# Patient Record
Sex: Male | Born: 1995 | Race: Black or African American | Hispanic: No | Marital: Single | Smoking: Former smoker
Health system: Southern US, Community
[De-identification: ages and names within clinical notes are randomized; demographics above are authoritative.]

## PROBLEM LIST (undated history)

## (undated) ENCOUNTER — Emergency Department (HOSPITAL_BASED_OUTPATIENT_CLINIC_OR_DEPARTMENT_OTHER): Admission: EM | Payer: Self-pay

---

## 2015-01-25 ENCOUNTER — Encounter (HOSPITAL_BASED_OUTPATIENT_CLINIC_OR_DEPARTMENT_OTHER): Payer: Self-pay

## 2015-01-25 ENCOUNTER — Emergency Department (HOSPITAL_BASED_OUTPATIENT_CLINIC_OR_DEPARTMENT_OTHER)
Admission: EM | Admit: 2015-01-25 | Discharge: 2015-01-25 | Disposition: A | Discharge status: Home / Self Care | Payer: Worker's Compensation | Attending: Emergency Medicine | Admitting: Emergency Medicine

## 2015-01-25 DIAGNOSIS — W01198A Fall on same level from slipping, tripping and stumbling with subsequent striking against other object, initial encounter: Secondary | ICD-10-CM | POA: Insufficient documentation

## 2015-01-25 DIAGNOSIS — Y9389 Activity, other specified: Secondary | ICD-10-CM | POA: Insufficient documentation

## 2015-01-25 DIAGNOSIS — S0990XA Unspecified injury of head, initial encounter: Secondary | ICD-10-CM | POA: Diagnosis not present

## 2015-01-25 DIAGNOSIS — Y92002 Bathroom of unspecified non-institutional (private) residence single-family (private) house as the place of occurrence of the external cause: Secondary | ICD-10-CM | POA: Insufficient documentation

## 2015-01-25 DIAGNOSIS — R42 Dizziness and giddiness: Secondary | ICD-10-CM | POA: Diagnosis present

## 2015-01-25 DIAGNOSIS — J302 Other seasonal allergic rhinitis: Secondary | ICD-10-CM | POA: Diagnosis not present

## 2015-01-25 DIAGNOSIS — Y998 Other external cause status: Secondary | ICD-10-CM | POA: Diagnosis not present

## 2015-01-25 MED ORDER — LORATADINE 10 MG PO TABS: 10.0000 mg | ORAL_TABLET | Freq: Every day | ORAL | Status: DC

## 2015-01-25 MED ORDER — LORATADINE 10 MG PO TABS
10.0000 mg | ORAL_TABLET | Freq: Once | ORAL | Status: AC
  Administered 2015-01-25: 10 mg via ORAL
  Filled 2015-01-25: qty 1

## 2015-01-25 MED ORDER — IBUPROFEN 600 MG PO TABS: 600.0000 mg | ORAL_TABLET | Freq: Four times a day (QID) | ORAL | Status: DC | PRN

## 2015-01-25 MED ORDER — FLUTICASONE PROPIONATE 50 MCG/ACT NA SUSP: 2.0000 | Freq: Every day | NASAL | Status: DC

## 2015-01-25 MED ORDER — ACETAMINOPHEN 500 MG PO TABS
1000.0000 mg | ORAL_TABLET | Freq: Once | ORAL | Status: AC
  Administered 2015-01-25: 1000 mg via ORAL
  Filled 2015-01-25: qty 2

## 2015-01-25 NOTE — Discharge Instructions (Signed)
Allergies  Allergies may happen from anything your body is sensitive to. This may be food, medicines, pollens, chemicals, and many other things. Food allergies can be severe and deadly.  HOME CARE  If you do not know what causes a reaction, keep a diary. Write down the foods you ate and the symptoms that followed. Avoid foods that cause reactions.  If you have red raised spots (hives) or a rash:  Take medicine as told by your doctor.  Use medicines for red raised spots and itching as needed.  Apply cold cloths (compresses) to the skin. Take a cool bath. Avoid hot baths or showers.  If you are severely allergic:  It is often necessary to go to the hospital after you have treated your reaction.  Wear your medical alert jewelry.  You and your family must learn how to give a allergy shot or use an allergy kit (anaphylaxis kit).  Always carry your allergy kit or shot with you. Use this medicine as told by your doctor if a severe reaction is occurring. GET HELP RIGHT AWAY IF:  You have trouble breathing or are making high-pitched whistling sounds (wheezing).  You have a tight feeling in your chest or throat.  You have a puffy (swollen) mouth.  You have red raised spots, puffiness (swelling), or itching all over your body.  You have had a severe reaction that was helped by your allergy kit or shot. The reaction can return once the medicine has worn off.  You think you are having a food allergy. Symptoms most often happen within 30 minutes of eating a food.  Your symptoms have not gone away within 2 days or are getting worse.  You have new symptoms.  You want to retest yourself with a food or drink you think causes an allergic reaction. Only do this under the care of a doctor. MAKE SURE YOU:   Understand these instructions.  Will watch your condition.  Will get help right away if you are not doing well or get worse. Document Released: 08/17/2012 Document Reviewed:  08/17/2012 [facility] Patient Information 2015 [place]. This information is not intended to replace advice given to you by your health care provider. Make sure you discuss any questions you have with your health care provider.

## 2015-01-25 NOTE — ED Notes (Signed)
Pt fell while at work last week and hit his head on the wall.  No LOC, did not get medical treatment at the time.  Pt c/o intermittent headaches since then.

## 2015-01-25 NOTE — ED Provider Notes (Signed)
CSN: [id number]     Arrival date & time 01/25/15  2251 History  This chart was scribed for April Palumbo, MD by Steva Colder, ED Scribe. The patient was seen in room MH04/MH04 at 11:10 PM.     Chief Complaint  Patient presents with  . Fall     Patient is a 19 y.o. male presenting with fall. The history is provided by the patient. No language interpreter was used.  Fall This is a new problem. The current episode started more than 1 week ago (9 days ago). The problem occurs rarely. The problem has not changed since onset.Associated symptoms include headaches. Pertinent negatives include no chest pain, no abdominal pain and no shortness of breath. Nothing aggravates the symptoms. Nothing relieves the symptoms. He has tried nothing for the symptoms. The treatment provided mild relief.    Evan Levy is a 19 y.o. male who presents to the Emergency Department via EMS complaining of fall onset last week. He notes that he fell while at work he slipped in the bathroom and he hit his head on the wall. He states that he didn't receive medical treatment at that time. He states that his HA is not worsened at any particular point throughout the day. Pt is having associated symptoms of intermittent HA and intermittent dizziness. He notes that he has not tried any medications for the relief of his symptoms. He denies hitting his head, LOC, vomiting, weakness, gait problem, and any other symptoms.    History reviewed. No pertinent past medical history. History reviewed. No pertinent past surgical history. History reviewed. No pertinent family history. Social History  Substance Use Topics  . Smoking status: None  . Smokeless tobacco: None  . Alcohol Use: None    Review of Systems  Eyes: Negative for photophobia and visual disturbance.  Respiratory: Negative for shortness of breath.   Cardiovascular: Negative for chest pain.  Gastrointestinal: Negative for nausea, vomiting and abdominal pain.   Musculoskeletal: Negative for gait problem.  Neurological: Positive for headaches. Negative for syncope, facial asymmetry, speech difficulty, weakness and numbness.  All other systems reviewed and are negative.   Allergies  Review of patient's allergies indicates no known allergies.  Home Medications   Prior to Admission medications   Medication Sig Start Date End Date Taking? Authorizing Provider  fluticasone (FLONASE) 50 MCG/ACT nasal spray Place 2 sprays into both nostrils daily. 01/25/15   April Palumbo, MD  ibuprofen (ADVIL,MOTRIN) 600 MG tablet Take 1 tablet (600 mg total) by mouth every 6 (six) hours as needed. 01/25/15   April Palumbo, MD  loratadine (CLARITIN) 10 MG tablet Take 1 tablet (10 mg total) by mouth daily. 01/25/15   April Palumbo, MD   BP 140/99 mmHg  Pulse 86  Temp(Src) 98.7 F (37.1 C) (Oral)  Resp 18  Ht 5' 11" (1.803 m)  Wt 169 lb 8 oz (76.885 kg)  BMI 23.65 kg/m2  SpO2 100% Physical Exam  Constitutional: He is oriented to person, place, and time. He appears well-developed and well-nourished. No distress.  HENT:  Head: Normocephalic and atraumatic. Head is without raccoon's eyes and without Battle's sign.  Right Ear: No mastoid tenderness. Tympanic membrane is retracted. Tympanic membrane is not injected. No hemotympanum.  Left Ear: No mastoid tenderness. Tympanic membrane is retracted. Tympanic membrane is not injected. No hemotympanum.  Mouth/Throat: Oropharynx is clear and moist.  Postnasal drip, clear and colorless.   Eyes: EOM are normal. Pupils are equal, round, and reactive to light.  Neck: Normal range of motion. Neck supple.  Cardiovascular: Normal rate, regular rhythm and normal heart sounds.  Exam reveals no gallop and no friction rub.   No murmur heard. Pulmonary/Chest: Effort normal and breath sounds normal. No respiratory distress. He has no wheezes. He has no rales.  Abdominal: Soft. Bowel sounds are normal. There is no tenderness. There is  no rebound.  Musculoskeletal: Normal range of motion.  No step-offs, crepitus, or tenderness of the c-spine.   Lymphadenopathy:    He has no cervical adenopathy.  No lymphadenopathy.   Neurological: He is alert and oriented to person, place, and time. He has normal reflexes. He displays normal reflexes. No cranial nerve deficit. He exhibits normal muscle tone.  5/5 motor strength throughout, gait is normal  Skin: Skin is warm and dry.  Psychiatric: He has a normal mood and affect. His behavior is normal.  Nursing note and vitals reviewed.   ED Course  Procedures (including critical care time) DIAGNOSTIC STUDIES: Oxygen Saturation is 100% on RA, nl by my interpretation.    COORDINATION OF CARE: 11:13 PM Discussed treatment plan with pt at bedside which includes claritin and tylenol and pt agreed to plan.    Labs Review Labs Reviewed - No data to display  Imaging Review No results found. I have personally reviewed and evaluated these images and lab results as part of my medical decision-making.   EKG Interpretation None      MDM   Final diagnoses:  Seasonal allergies   Based on PECARN study there is no indication for a head CT in this pain I suspect this headache is not trauma related and is actually allergy related as patient is sniffling in the room with PND and retracted TMs B.  If patient had intracranial trauma we would have seen signs of this already since the fall was nine days ago and the patient does not think he hit his head.  Will treat symptomatically.  Follow up with your family doctor for ongoing care  I personally performed the services described in this documentation, which was scribed in my presence. The recorded information has been reviewed and is accurate.      Veatrice Kells, MD 01/26/15 0425

## 2015-03-10 ENCOUNTER — Encounter (HOSPITAL_BASED_OUTPATIENT_CLINIC_OR_DEPARTMENT_OTHER): Payer: Self-pay | Admitting: *Deleted

## 2015-03-10 ENCOUNTER — Emergency Department (HOSPITAL_BASED_OUTPATIENT_CLINIC_OR_DEPARTMENT_OTHER)
Admission: EM | Admit: 2015-03-10 | Discharge: 2015-03-10 | Discharge status: Against Medical Advice | Payer: Medicaid Other | Attending: Emergency Medicine | Admitting: Emergency Medicine

## 2015-03-10 DIAGNOSIS — R0981 Nasal congestion: Secondary | ICD-10-CM | POA: Insufficient documentation

## 2015-03-10 DIAGNOSIS — Z720 Tobacco use: Secondary | ICD-10-CM | POA: Insufficient documentation

## 2015-03-10 NOTE — ED Notes (Signed)
Nasal congestion.

## 2015-05-16 ENCOUNTER — Encounter (HOSPITAL_BASED_OUTPATIENT_CLINIC_OR_DEPARTMENT_OTHER): Payer: Self-pay | Admitting: *Deleted

## 2015-05-16 DIAGNOSIS — F17210 Nicotine dependence, cigarettes, uncomplicated: Secondary | ICD-10-CM | POA: Insufficient documentation

## 2015-05-16 DIAGNOSIS — Z8619 Personal history of other infectious and parasitic diseases: Secondary | ICD-10-CM | POA: Insufficient documentation

## 2015-05-16 DIAGNOSIS — Z79899 Other long term (current) drug therapy: Secondary | ICD-10-CM | POA: Insufficient documentation

## 2015-05-16 DIAGNOSIS — Z7951 Long term (current) use of inhaled steroids: Secondary | ICD-10-CM | POA: Diagnosis not present

## 2015-05-16 DIAGNOSIS — N342 Other urethritis: Secondary | ICD-10-CM | POA: Insufficient documentation

## 2015-05-16 DIAGNOSIS — R369 Urethral discharge, unspecified: Secondary | ICD-10-CM | POA: Diagnosis present

## 2015-05-16 LAB — URINALYSIS, ROUTINE W REFLEX MICROSCOPIC
Bilirubin Urine: NEGATIVE
GLUCOSE, UA: NEGATIVE mg/dL
HGB URINE DIPSTICK: NEGATIVE
Ketones, ur: NEGATIVE mg/dL
Nitrite: NEGATIVE
Protein, ur: NEGATIVE mg/dL
SPECIFIC GRAVITY, URINE: 1.026 (ref 1.005–1.030)
pH: 6.5 (ref 5.0–8.0)

## 2015-05-16 LAB — URINE MICROSCOPIC-ADD ON: BACTERIA UA: NONE SEEN

## 2015-05-16 NOTE — ED Notes (Signed)
Pt c/o penile discharge x 4 days

## 2015-05-17 ENCOUNTER — Emergency Department (HOSPITAL_BASED_OUTPATIENT_CLINIC_OR_DEPARTMENT_OTHER)
Admission: EM | Admit: 2015-05-17 | Discharge: 2015-05-17 | Disposition: A | Discharge status: Home / Self Care | Payer: Medicaid Other | Attending: Emergency Medicine | Admitting: Emergency Medicine

## 2015-05-17 DIAGNOSIS — N342 Other urethritis: Secondary | ICD-10-CM

## 2015-05-17 LAB — GC/CHLAMYDIA PROBE AMP (~~LOC~~) NOT AT ARMC
Chlamydia: POSITIVE — AB
NEISSERIA GONORRHEA: NEGATIVE

## 2015-05-17 MED ORDER — AZITHROMYCIN 250 MG PO TABS
1000.0000 mg | ORAL_TABLET | Freq: Once | ORAL | Status: AC
  Administered 2015-05-17: 1000 mg via ORAL
  Filled 2015-05-17: qty 4

## 2015-05-17 MED ORDER — LIDOCAINE HCL (PF) 1 % IJ SOLN
INTRAMUSCULAR | Status: AC
  Administered 2015-05-17: 1.2 mL
  Filled 2015-05-17: qty 5

## 2015-05-17 MED ORDER — CEFTRIAXONE SODIUM 250 MG IJ SOLR
250.0000 mg | Freq: Once | INTRAMUSCULAR | Status: AC
  Administered 2015-05-17: 250 mg via INTRAMUSCULAR
  Filled 2015-05-17: qty 250

## 2015-05-17 NOTE — Discharge Instructions (Signed)
Urethritis, Adult Urethritis is an inflammation of the tube through which urine exits your bladder (urethra).  CAUSES Urethritis is often caused by an infection in your urethra. The infection can be viral, like herpes. The infection can also be bacterial, like gonorrhea. RISK FACTORS Risk factors of urethritis include:  Having sex without using a condom.  Having multiple sexual partners.  Having poor hygiene. SIGNS AND SYMPTOMS Symptoms of urethritis are less noticeable in women than in men. These symptoms include:  Burning feeling when you urinate (dysuria).  Discharge from your urethra.  Blood in your urine (hematuria).  Urinating more than usual. DIAGNOSIS  To confirm a diagnosis of urethritis, your health care provider will do the following:  Ask about your sexual history.  Perform a physical exam.  Have you provide a sample of your urine for lab testing.  Use a cotton swab to gently collect a sample from your urethra for lab testing. TREATMENT  It is important to treat urethritis. Depending on the cause, untreated urethritis may lead to serious genital infections and possibly infertility. Urethritis caused by a bacterial infection is treated with antibiotic medicine. All sexual partners must be treated.  HOME CARE INSTRUCTIONS  Do not have sex until the test results are known and treatment is completed, even if your symptoms go away before you finish treatment.  If you were prescribed an antibiotic, finish it all even if you start to feel better. SEEK MEDICAL CARE IF:   Your symptoms are not improved in 3 days.  Your symptoms are getting worse.  You develop abdominal pain or pelvic pain (in women).  You develop joint pain.  You have a fever. SEEK IMMEDIATE MEDICAL CARE IF:   You have severe pain in the belly, back, or side.  You have repeated vomiting. MAKE SURE YOU:  Understand these instructions.  Will watch your condition.  Will get help right away  if you are not doing well or get worse.   This information is not intended to replace advice given to you by your health care provider. Make sure you discuss any questions you have with your health care provider.   Document Released: 10/16/2000 Document Revised: 09/06/2014 Document Reviewed: 12/21/2012 Elsevier Interactive Patient Education [employer].

## 2015-05-17 NOTE — ED Provider Notes (Signed)
CSN: [id number]     Arrival date & time 05/16/15  2255 History   First MD Initiated Contact with Patient 05/17/15 0200     Chief Complaint  Patient presents with  . Penile Discharge     (Consider location/radiation/quality/duration/timing/severity/associated sxs/prior Treatment) HPI  This is a 20 year old male with a four-day history of penile discharge. He is vague about the nature of the discharge. He is having some mild burning with urination. His pregnant girlfriend was recently diagnosed with, and treated for, chlamydia. They have not had sexual intercourse since she was treated. He denies abdominal pain.  History reviewed. No pertinent past medical history. History reviewed. No pertinent past surgical history. History reviewed. No pertinent family history. Social History  Substance Use Topics  . Smoking status: Current Every Day Smoker -- 1.00 packs/day    Types: Cigarettes  . Smokeless tobacco: None  . Alcohol Use: No    Review of Systems  All other systems reviewed and are negative.   Allergies  Review of patient's allergies indicates no known allergies.  Home Medications   Prior to Admission medications   Medication Sig Start Date End Date Taking? Authorizing Provider  fluticasone (FLONASE) 50 MCG/ACT nasal spray Place 2 sprays into both nostrils daily. 01/25/15   April Palumbo, MD  ibuprofen (ADVIL,MOTRIN) 600 MG tablet Take 1 tablet (600 mg total) by mouth every 6 (six) hours as needed. 01/25/15   April Palumbo, MD  loratadine (CLARITIN) 10 MG tablet Take 1 tablet (10 mg total) by mouth daily. 01/25/15   April Palumbo, MD   BP 133/74 mmHg  Pulse 75  Temp(Src) 97.2 F (36.2 C) (Oral)  Resp 16  Ht 5' 10" (1.778 m)  Wt 160 lb (72.576 kg)  BMI 22.96 kg/m2  SpO2 100%   Physical Exam  General: Well-developed, well-nourished male in no acute distress; appearance consistent with age of record HENT: normocephalic; atraumatic Eyes: Normal appearance Neck:  supple Heart: regular rate and rhythm Lungs: Normal respiratory effort and excursion Abdomen: soft; nondistended; nontender GU: Tanner 5 male, circumcised; no meatal discharge seen Extremities: No deformity; full range of motion Neurologic: Awake, alert and oriented; motor function intact in all extremities and symmetric; no facial droop Skin: Warm and dry Psychiatric: Normal mood and affect    ED Course  Procedures (including critical care time)   MDM   Nursing notes and vitals signs, including pulse oximetry, reviewed.  Summary of this visit's results, reviewed by myself:  Labs:  Results for orders placed or performed during the hospital encounter of 05/17/15 (from the past 24 hour(s))  Urinalysis, Routine w reflex microscopic (not at [facility])     Status: Abnormal   Collection Time: 05/16/15 11:05 PM  Result Value Ref Range   Color, Urine YELLOW YELLOW   APPearance CLEAR CLEAR   Specific Gravity, Urine 1.026 1.005 - 1.030   pH 6.5 5.0 - 8.0   Glucose, UA NEGATIVE NEGATIVE mg/dL   Hgb urine dipstick NEGATIVE NEGATIVE   Bilirubin Urine NEGATIVE NEGATIVE   Ketones, ur NEGATIVE NEGATIVE mg/dL   Protein, ur NEGATIVE NEGATIVE mg/dL   Nitrite NEGATIVE NEGATIVE   Leukocytes, UA TRACE (A) NEGATIVE  Urine microscopic-add on     Status: Abnormal   Collection Time: 05/16/15 11:05 PM  Result Value Ref Range   Squamous Epithelial / LPF 0-5 (A) NONE SEEN   WBC, UA 6-30 0 - 5 WBC/hpf   RBC / HPF 0-5 0 - 5 RBC/hpf   Bacteria, UA NONE SEEN  NONE SEEN   Urine-Other MUCOUS PRESENT    2:07 AM We'll treat for GC and chlamydia based on symptoms and positive chlamydia in his sexual partner.     Shanon Rosser, MD 05/17/15 [phone]

## 2015-05-18 LAB — RPR: RPR: NONREACTIVE

## 2015-05-18 LAB — HIV ANTIBODY (ROUTINE TESTING W REFLEX): HIV SCREEN 4TH GENERATION: NONREACTIVE

## 2015-07-01 ENCOUNTER — Encounter (HOSPITAL_BASED_OUTPATIENT_CLINIC_OR_DEPARTMENT_OTHER): Payer: Self-pay

## 2015-07-01 ENCOUNTER — Emergency Department (HOSPITAL_BASED_OUTPATIENT_CLINIC_OR_DEPARTMENT_OTHER)
Admission: EM | Admit: 2015-07-01 | Discharge: 2015-07-01 | Disposition: A | Discharge status: Home / Self Care | Payer: Medicaid Other | Attending: Emergency Medicine | Admitting: Emergency Medicine

## 2015-07-01 ENCOUNTER — Emergency Department (HOSPITAL_BASED_OUTPATIENT_CLINIC_OR_DEPARTMENT_OTHER): Payer: Medicaid Other

## 2015-07-01 DIAGNOSIS — Y9289 Other specified places as the place of occurrence of the external cause: Secondary | ICD-10-CM | POA: Diagnosis not present

## 2015-07-01 DIAGNOSIS — Y998 Other external cause status: Secondary | ICD-10-CM | POA: Diagnosis not present

## 2015-07-01 DIAGNOSIS — Y9389 Activity, other specified: Secondary | ICD-10-CM | POA: Insufficient documentation

## 2015-07-01 DIAGNOSIS — S62308A Unspecified fracture of other metacarpal bone, initial encounter for closed fracture: Secondary | ICD-10-CM

## 2015-07-01 DIAGNOSIS — F17210 Nicotine dependence, cigarettes, uncomplicated: Secondary | ICD-10-CM | POA: Diagnosis not present

## 2015-07-01 DIAGNOSIS — W500XXA Accidental hit or strike by another person, initial encounter: Secondary | ICD-10-CM | POA: Diagnosis not present

## 2015-07-01 DIAGNOSIS — S6991XA Unspecified injury of right wrist, hand and finger(s), initial encounter: Secondary | ICD-10-CM | POA: Diagnosis present

## 2015-07-01 DIAGNOSIS — S62398A Other fracture of other metacarpal bone, initial encounter for closed fracture: Secondary | ICD-10-CM | POA: Insufficient documentation

## 2015-07-01 MED ORDER — HYDROCODONE-ACETAMINOPHEN 5-325 MG PO TABS: 1.0000 | ORAL_TABLET | ORAL | Status: AC | PRN

## 2015-07-01 MED ORDER — ACETAMINOPHEN 325 MG PO TABS
650.0000 mg | ORAL_TABLET | Freq: Once | ORAL | Status: AC
Start: 2015-07-01 — End: 2015-07-01
  Administered 2015-07-01: 650 mg via ORAL
  Filled 2015-07-01: qty 2

## 2015-07-01 NOTE — Discharge Instructions (Signed)
1. Medications: vicodin for pain, usual home medications 2. Treatment: rest, drink plenty of fluids, ice, elevate, wear splint 3. Follow Up: please followup with the hand specialist (call Monday to make appointment) for discussion of your diagnoses and further evaluation after today's visit; if you do not have a primary care doctor use the resource guide provided to find one; please return to the ER for increased pain, swelling, numbness, color change, new or worsening symptoms   Emergency Department Resource Guide 1) Find a Doctor and Pay Out of Pocket Although you won't have to find out who is covered by your insurance plan, it is a good idea to ask around and get recommendations. You will then need to call the office and see if the doctor you have chosen will accept you as a new patient and what types of options they offer for patients who are self-pay. Some doctors offer discounts or will set up payment plans for their patients who do not have insurance, but you will need to ask so you aren't surprised when you get to your appointment.  2) Contact Your Local Health Department Not all health departments have doctors that can see patients for sick visits, but many do, so it is worth a call to see if yours does. If you don't know where your local health department is, you can check in your phone book. The CDC also has a tool to help you locate your state's health department, and many state websites also have listings of all of their local health departments.  3) Find a [facility] If your illness is not likely to be very severe or complicated, you may want to try a walk in clinic. These are popping up all over the country in pharmacies, drugstores, and shopping centers. They're usually staffed by nurse practitioners or physician assistants that have been trained to treat common illnesses and complaints. They're usually fairly quick and inexpensive. However, if you have serious medical issues or  chronic medical problems, these are probably not your best option.  No Primary Care Doctor: - Call Health Connect at  [phone] - they can help you locate a primary care doctor that  accepts your insurance, provides certain services, etc. - Physician Referral Service- [phone]  Chronic Pain Problems: Organization         Address  Phone   Notes  [facility]  [phone] Patients need to be referred by their primary care doctor.   Medication Assistance: Organization         Address  Phone   Notes  [facility] Medication [facility] [address] [place], [place] [address] [phone] --Must be a resident of [facility] -- Must have NO insurance coverage whatsoever (no Medicaid/ Medicare, etc.) -- The pt. MUST have a primary care doctor that directs their care regularly and follows them in the community   MedAssist  [phone]   [employer]  [phone]    Agencies that provide inexpensive medical care: Organization         Address  Phone   Notes  [place]  [phone]   [facility]    [phone]   [facility] [place], [place] [address] [phone]   [place] [address], [place] [phone]   Planned Parenthood    [phone]   [facility]    [phone])  [place]  [address], [place] Phone:  [phone], Fax:  [phone] Hours of Operation:  9 am - 6 pm, M-F.  Also accepts Medicaid/Medicare and self-pay.  [facility] for [place], [address], [place] Phone: [phone], Fax: [phone]. Hours of Operation:  8:30 am - 5:30 pm, M-F.  Also accepts Medicaid and self-pay.  [facility] [place] [address], [place] Phone: [phone]   [place], [place], [place]  [phone], Ext. [phone] Mondays & Thursdays: 7-9 AM.  First 15 patients are seen on a first come, first serve basis.    [place] Providers:  Organization         Address  Phone   Notes  [facility] [address], [place] [phone] Also accepts self-pay patients.  [facility] [address], [place]  [phone]   [place], [address], [place] [phone]   [facility] [address], [place] [phone]   Lucianne Lei [address], [place]   [phone] Only accepts [place] Access [place] patients after they have their name applied to their card.   Self-Pay (no insurance) in [facility]:  Organization         Address  Phone   Notes  Sickle Cell Patients, [facility] Internal [facility] [place] [phone]   [facility] [place] [phone]   [facility] [place]  [place], [place], [place] [phone]   [facility]/Dr. Osei-Bonsu  [address], [place] or [address], [place] [phone] Phone number for both [place] and [place] locations is the same.  [facility]gent [facility] [address] [place] [phone]   [facility] [address] [place] or [address] [phone]   [facility] [address] [place] [phone], phone; [phone], fax Sees patients 1st and 3rd Saturday of every month.  Must not qualify for public or private insurance (i.e. Medicaid, Medicare, [place] Health Choice, Veterans' Benefits)  Household income should be no more than 200% of the poverty level The clinic cannot treat you if you are pregnant or think you are pregnant  Sexually transmitted  diseases are not treated at the clinic.    Dental Care: Organization         Address  Phone  Notes  [facility] Department of [facility] [place] [phone] Accepts children up to age 49 who are enrolled in [place] or [place]; pregnant women with a Medicaid card; and children who have applied for Medicaid or [place] Health Choice, but were declined, whose parents can pay a reduced fee at time of service.  [facility] Department of [facility]  [address], [place] [phone] Accepts children up to age 29 who are enrolled in [place] or [place]; pregnant women with a Medicaid card; and children who have applied for Medicaid or  Health Choice, but were declined, whose parents can pay a reduced fee at time of service.  [place] Adult Dental Access  PROGRAM  [place] [phone] Patients are seen by appointment only. Walk-ins are not accepted. [place] will see patients 59 years of age and older. Monday - Tuesday (8am-5pm) Most Wednesdays (8:30-5pm) $30 per visit, cash only  [facility] Adult Dental Access PROGRAM  [address], [facility] [phone] Patients are seen by appointment only. Walk-ins are not accepted. [place] will see patients 81 years of age and older. One Wednesday Evening (Monthly: Volunteer Based).  $30 per visit, cash only  [place]  [phone] for adults; Children under age 54, call Graduate Pediatric Dentistry at [phone]. Children aged 50-14, please call [phone] to request a pediatric application.  Dental services are provided in all areas of dental care including fillings, crowns and bridges, complete and partial dentures, implants, gum treatment, root canals, and extractions. Preventive care is also provided. Treatment is provided to both adults and children. Patients are selected via a  lottery and there is often a waiting list.   [facility] [address] [place]  [phone] [email or web address]   [facility] [address] [place], [place] [phone], Ext. [phone] Second and Fourth Thursday of each month, opens at 6:30 AM; Clinic ends at 9 AM.  Patients are seen on a first-come first-served basis, and a limited number are seen during each clinic.   [facility]  [address] [place], [place] [phone]   Eligibility Requirements You must have lived in [place], [place], or [place] counties for at least the last three months.   You cannot be eligible for state or federal sponsored [employer], including [employer], [place], or [employer].   You generally cannot be eligible for healthcare insurance through your employer.    How to apply: Eligibility screenings are held every Tuesday and Wednesday afternoon from 1:00 pm until 4:00 pm. You do not need an appointment for the interview!  [facility] [address] [place], [place]   [place] Department  [place]  [phone]    Behavioral Health Resources in the Community: Intensive Outpatient Programs Organization         Address  Phone  Notes  [place]. [address], [place], [place] [phone]   [facility] [address], [place], [place]   ADS: Alcohol & Drug Svcs [address] [place], [place]   [place] [address],  [place], [place] or [phone]   Substance Abuse Resources Organization         Address  Phone  Notes  Alcohol and Drug Services  [phone]   [place]  [phone]   [facility] [place]   Chinita Pester  [phone]   Residential &  Outpatient Substance Abuse Program  [phone]   Psychological Services Organization         Address  Phone  Notes  [facility] [place]  [place]  [phone]   [place] [address], [place] or [phone]    Mobile Crisis Teams Organization         Address  Phone  Notes  [facility], Mobile Crisis Care Unit  [phone]   [facility]  [address] [place], [place]  [place], [place] [phone]    Self-Help/Support Groups Organization         Address  Phone             Notes  [facility]ental [facility] of [place] - variety of support groups  [place] Call for more information  Narcotics Anonymous (NA), [facility] [address], [employer] [place]  2 meetings at this location   Special educational needs teacher         Address  Phone  Notes  [facility]idential [facility] [place],    [place]  [phone]   [facility]  [address], [place] [address], [place], [place]   [place], [place] [phone] Admissions: 8am-3pm M-F  Incentives Substance [place] [address]    [place], [place] [phone]   [facility] [address] [place], [place], [place]   [facility] [address]  [place], [place]   [facility] - Intensive Outpatient [address], [place], [place]   [facility] ([place] [place].,  [place], [place] [phone] or [phone]   [facility] (RTS) [address] [place], [place] Accepts Medicaid  [facility] [place] [address]  [place] [phone] Substance Abuse/Addiction Treatment   [facility] Organization          Address  Phone  Notes  [facility]  [phone]   Domenic Schwab, PhD [address] Arlis [place], [place]   [phone] or [phone]   [place], [place] [phone]   [facility] [address], [place], [place] [phone] Insurance/Medicaid/sponsorship through [facility] and Families [address], [address]                                    [place], [place] [phone] [place] [address], [place] [phone]    Dr. Adele Schilder  [phone]   [facility] Dept. 1) [address], [place] 2) [place] 3)  [place] [address], [place] [phone]  [phone]   [place] [phone] or [phone] (After Hours)
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
## 2016-03-01 ENCOUNTER — Emergency Department (HOSPITAL_BASED_OUTPATIENT_CLINIC_OR_DEPARTMENT_OTHER)
Admission: EM | Admit: 2016-03-01 | Discharge: 2016-03-01 | Disposition: A | Discharge status: Home / Self Care | Payer: Medicaid Other | Attending: Emergency Medicine | Admitting: Emergency Medicine

## 2016-03-01 ENCOUNTER — Encounter (HOSPITAL_BASED_OUTPATIENT_CLINIC_OR_DEPARTMENT_OTHER): Payer: Self-pay | Admitting: Emergency Medicine

## 2016-03-01 DIAGNOSIS — F17210 Nicotine dependence, cigarettes, uncomplicated: Secondary | ICD-10-CM | POA: Insufficient documentation

## 2016-03-01 DIAGNOSIS — N342 Other urethritis: Secondary | ICD-10-CM

## 2016-03-01 LAB — URINALYSIS, ROUTINE W REFLEX MICROSCOPIC
BILIRUBIN URINE: NEGATIVE
GLUCOSE, UA: NEGATIVE mg/dL
Hgb urine dipstick: NEGATIVE
KETONES UR: NEGATIVE mg/dL
Nitrite: NEGATIVE
PH: 6.5 (ref 5.0–8.0)
Protein, ur: NEGATIVE mg/dL
Specific Gravity, Urine: 1.024 (ref 1.005–1.030)

## 2016-03-01 LAB — URINE MICROSCOPIC-ADD ON

## 2016-03-01 MED ORDER — LIDOCAINE HCL (PF) 1 % IJ SOLN
INTRAMUSCULAR | Status: AC
  Administered 2016-03-01: 1.2 mL
  Filled 2016-03-01: qty 5

## 2016-03-01 MED ORDER — AZITHROMYCIN 250 MG PO TABS
1000.0000 mg | ORAL_TABLET | Freq: Once | ORAL | Status: AC
  Administered 2016-03-01: 1000 mg via ORAL
  Filled 2016-03-01: qty 4

## 2016-03-01 MED ORDER — CEFTRIAXONE SODIUM 250 MG IJ SOLR
250.0000 mg | Freq: Once | INTRAMUSCULAR | Status: AC
  Administered 2016-03-01: 250 mg via INTRAMUSCULAR
  Filled 2016-03-01: qty 250

## 2016-03-01 NOTE — ED Provider Notes (Signed)
[place] DEPT [facility] Provider Note   CSN: [id number] Arrival date & time: 03/01/16  1429     History   Chief Complaint Chief Complaint  Patient presents with  . Hematuria    HPI Evan Levy is a 20 y.o. male.  Patient is a 20 year old male with no significant past medical history. He presents for evaluation of burning with urination for the past several days. Yesterday he took a urine drug test as part of the job application. He states that he noted a slight amount of blood in his urine. He is sexually active.   The history is provided by the patient.  Hematuria  This is a new problem. The problem occurs constantly. The problem has not changed since onset.Exacerbated by: Urinate. Nothing relieves the symptoms. He has tried nothing for the symptoms.    History reviewed. No pertinent past medical history.  There are no active problems to display for this patient.   History reviewed. No pertinent surgical history.     Home Medications    Prior to Admission medications   Medication Sig Start Date End Date Taking? Authorizing Provider  HYDROcodone-acetaminophen (NORCO/VICODIN) 5-325 MG tablet Take 1 tablet by mouth every 4 (four) hours as needed. 07/01/15   Marella Chimes, PA-C    Family History History reviewed. No pertinent family history.  Social History Social History  Substance Use Topics  . Smoking status: Current Every Day Smoker    Packs/day: 1.00    Types: Cigarettes  . Smokeless tobacco: Never Used  . Alcohol use No     Allergies   Review of patient's allergies indicates no known allergies.   Review of Systems Review of Systems  Genitourinary: Positive for hematuria.  All other systems reviewed and are negative.    Physical Exam Updated Vital Signs BP 138/92 (BP Location: Left Arm)   Pulse 76   Temp 98.3 F (36.8 C) (Oral)   Resp 18   Ht 5' 10" (1.778 m)   Wt 169 lb (76.7 kg)   SpO2 100%   BMI 24.25 kg/m   Physical Exam    Constitutional: He is oriented to person, place, and time. He appears well-developed and well-nourished. No distress.  HENT:  Head: Normocephalic and atraumatic.  Neck: Normal range of motion. Neck supple.  Genitourinary: Penis normal.  Genitourinary Comments: There is a slight penile discharge present. There is otherwise no obvious abnormality of the genitalia.  Neurological: He is alert and oriented to person, place, and time.  Skin: Skin is warm and dry. He is not diaphoretic.  Nursing note and vitals reviewed.    ED Treatments / Results  Labs (all labs ordered are listed, but only abnormal results are displayed) Labs Reviewed  URINALYSIS, ROUTINE W REFLEX MICROSCOPIC (NOT AT [facility])  GC/CHLAMYDIA PROBE AMP ([place]) NOT AT [facility]    EKG  EKG Interpretation None       Radiology No results found.  Procedures Procedures (including critical care time)  Medications Ordered in ED Medications - No data to display   Initial Impression / Assessment and Plan / ED Course  I have reviewed the triage vital signs and the nursing notes.  Pertinent labs & imaging results that were available during my care of the patient were reviewed by me and considered in my medical decision making (see chart for details).  Clinical Course    Patient presents with complaints of burning with urination. He has a history of chlamydia diagnosed in January 2017 and  this feels similar. He is vague when asked about sexual contacts. His urinalysis reveals moderate leukocytes. I suspect an STD. He will be treated with Rocephin and Zithromax pending culture results.  Final Clinical Impressions(s) / ED Diagnoses   Final diagnoses:  None    New Prescriptions New Prescriptions   No medications on file     Veryl Speak, MD 03/01/16 1519

## 2016-03-01 NOTE — Discharge Instructions (Signed)
We will call you if your cultures indicate you require further treatment or need to take further action.  Return to the emergency department if symptoms significantly worsen or change.

## 2016-03-01 NOTE — ED Triage Notes (Signed)
Patient reports that he has burning with urination "for a while" - yesterday he took a drug test and he noticed that after he held his urine he had blood in his urine. Today he continues to have burning and is now worried

## 2016-03-01 NOTE — ED Notes (Signed)
MD at bedside.

## 2016-03-04 LAB — GC/CHLAMYDIA PROBE AMP (~~LOC~~) NOT AT ARMC
CHLAMYDIA, DNA PROBE: NEGATIVE
NEISSERIA GONORRHEA: NEGATIVE

## 2016-12-17 ENCOUNTER — Emergency Department (HOSPITAL_BASED_OUTPATIENT_CLINIC_OR_DEPARTMENT_OTHER)
Admission: EM | Admit: 2016-12-17 | Discharge: 2016-12-17 | Disposition: A | Discharge status: Home / Self Care | Payer: Medicaid Other | Attending: Emergency Medicine | Admitting: Emergency Medicine

## 2016-12-17 ENCOUNTER — Encounter (HOSPITAL_BASED_OUTPATIENT_CLINIC_OR_DEPARTMENT_OTHER): Payer: Self-pay | Admitting: *Deleted

## 2016-12-17 DIAGNOSIS — N341 Nonspecific urethritis: Secondary | ICD-10-CM

## 2016-12-17 DIAGNOSIS — F17210 Nicotine dependence, cigarettes, uncomplicated: Secondary | ICD-10-CM | POA: Insufficient documentation

## 2016-12-17 DIAGNOSIS — A64 Unspecified sexually transmitted disease: Secondary | ICD-10-CM

## 2016-12-17 DIAGNOSIS — N342 Other urethritis: Secondary | ICD-10-CM | POA: Insufficient documentation

## 2016-12-17 MED ORDER — CEFTRIAXONE SODIUM 250 MG IJ SOLR
250.0000 mg | Freq: Once | INTRAMUSCULAR | Status: AC
  Administered 2016-12-17: 250 mg via INTRAMUSCULAR
  Filled 2016-12-17: qty 250

## 2016-12-17 MED ORDER — AZITHROMYCIN 1 G PO PACK
1.0000 g | PACK | Freq: Once | ORAL | Status: AC
  Administered 2016-12-17: 1 g via ORAL
  Filled 2016-12-17: qty 1

## 2016-12-17 MED ORDER — DOXYCYCLINE HYCLATE 100 MG PO CAPS: 100.0000 mg | ORAL_CAPSULE | Freq: Two times a day (BID) | ORAL | 0 refills | Status: AC

## 2016-12-17 NOTE — ED Notes (Signed)
ED Provider at bedside.

## 2016-12-17 NOTE — ED Provider Notes (Signed)
[facility] Provider Note   CSN: [id number] Arrival date & time: 12/17/16  0609     History   Chief Complaint Chief Complaint  Patient presents with  . Penile Discharge    HPI Evan Levy is a 21 y.o. male.  The history is provided by the patient.  Penile Discharge  This is a recurrent problem. The current episode started more than 2 days ago. The problem occurs constantly. The problem has not changed since onset.Pertinent negatives include no chest pain, no abdominal pain, no headaches and no shortness of breath. Nothing aggravates the symptoms. Nothing relieves the symptoms. He has tried nothing for the symptoms. The treatment provided no relief.    History reviewed. No pertinent past medical history.  There are no active problems to display for this patient.   History reviewed. No pertinent surgical history.     Home Medications    Prior to Admission medications   Medication Sig Start Date End Date Taking? Authorizing Provider  HYDROcodone-acetaminophen (NORCO/VICODIN) 5-325 MG tablet Take 1 tablet by mouth every 4 (four) hours as needed. 07/01/15   Marella Chimes, PA-C    Family History No family history on file.  Social History Social History  Substance Use Topics  . Smoking status: Current Every Day Smoker    Packs/day: 1.00    Types: Cigarettes  . Smokeless tobacco: Never Used  . Alcohol use No     Allergies   Patient has no known allergies.   Review of Systems Review of Systems  Respiratory: Negative for shortness of breath.   Cardiovascular: Negative for chest pain.  Gastrointestinal: Negative for abdominal pain.  Genitourinary: Positive for discharge. Negative for decreased urine volume, dysuria, genital sores and urgency.  Neurological: Negative for headaches.  All other systems reviewed and are negative.    Physical Exam Updated Vital Signs BP 130/89   Pulse 75   Temp 98.2 F (36.8 C) (Oral)   Resp 16   Ht 5'  8" (1.727 m)   Wt 75.3 kg (166 lb)   SpO2 98%   BMI 25.24 kg/m   Physical Exam  Constitutional: He appears well-developed and well-nourished.  HENT:  Head: Normocephalic and atraumatic.  Mouth/Throat: No oropharyngeal exudate.  Eyes: Conjunctivae and EOM are normal.  Neck: Normal range of motion.  Cardiovascular: Normal rate, regular rhythm, normal heart sounds and intact distal pulses.   Pulmonary/Chest: Effort normal and breath sounds normal. No respiratory distress. He has no wheezes. He has no rales.  Abdominal: Soft. Bowel sounds are normal. He exhibits no mass. There is no tenderness. There is no rebound and no guarding.  Genitourinary: Discharge found.  Genitourinary Comments: Yellow purulent thick discharge.  Chaperone present.  Minimally enlarged freely mobile lymph nodes B  Musculoskeletal: Normal range of motion.  Neurological: He is alert.  Skin: Skin is warm and dry. Capillary refill takes less than 2 seconds.  Psychiatric: He has a normal mood and affect.     ED Treatments / Results   Vitals:   12/17/16 0621  BP: 130/89  Pulse: 75  Resp: 16  Temp: 98.2 F (36.8 C)  SpO2: 98%    Radiology No results found.  Procedures Procedures (including critical care time)  Medications Ordered in ED Medications  cefTRIAXone (ROCEPHIN) injection 250 mg (not administered)  azithromycin (ZITHROMAX) powder 1 g (not administered)       Final Clinical Impressions(s) / ED Diagnoses  Urethritis:  Given nodes will d/c on 7 days of  doxy.  [facility]  No sexual activity of any kind until 7 days after all partners treated.  You must inform all partners.  After that period you should only have sex using a condom.    The patient is very well appearing and has been observed in the ED.  Strict return precautions given for facial swelling, persistent fevers > 1 week, drooling, swelling of the mouth or throat, intractable vomiting, weakness,   intractable abdominal pain, hard or rigid abdomen, inability to pass gas or stool, intractable diarrhea.  inability to tolerate oral liquids or foods, shortness of breath, changes in vision or thinking, chest pain, dyspnea on exertion, weakness or numbness or any concerns. No signs of systemic illness or infection. The patient is nontoxic-appearing on exam and vital signs are within normal limits.   I have reviewed the triage vital signs and the nursing notes. Pertinent labs &imaging results that were available during my care of the patient were reviewed by me and considered in my medical decision making (see chart for details).  After history, exam, and medical workup I feel the patient has been appropriately medically screened and is safe for discharge home. Pertinent diagnoses were discussed with the patient. Patient was given return precautions.    Deyjah Kindel, MD 12/17/16 [phone]

## 2016-12-17 NOTE — ED Triage Notes (Signed)
Pt ambulatory to treatment room. Patient states painful urination and yellow penile discharge since yesterday. Pt also reports "knots" in his pelvis area for several days. Several days ago he had pain in his throat, now feels like he is having swelling in his throat. No difficulty swallowing.

## 2016-12-18 LAB — GC/CHLAMYDIA PROBE AMP (~~LOC~~) NOT AT ARMC
CHLAMYDIA, DNA PROBE: NEGATIVE
Neisseria Gonorrhea: POSITIVE — AB
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
## 2019-11-15 ENCOUNTER — Other Ambulatory Visit: Payer: Self-pay

## 2019-11-15 ENCOUNTER — Emergency Department (HOSPITAL_BASED_OUTPATIENT_CLINIC_OR_DEPARTMENT_OTHER): Payer: Self-pay

## 2019-11-15 ENCOUNTER — Encounter (HOSPITAL_BASED_OUTPATIENT_CLINIC_OR_DEPARTMENT_OTHER): Payer: Self-pay | Admitting: *Deleted

## 2019-11-15 ENCOUNTER — Emergency Department (HOSPITAL_BASED_OUTPATIENT_CLINIC_OR_DEPARTMENT_OTHER)
Admission: EM | Admit: 2019-11-15 | Discharge: 2019-11-15 | Disposition: A | Discharge status: Home / Self Care | Payer: Self-pay | Attending: Emergency Medicine | Admitting: Emergency Medicine

## 2019-11-15 DIAGNOSIS — Z5321 Procedure and treatment not carried out due to patient leaving prior to being seen by health care provider: Secondary | ICD-10-CM | POA: Insufficient documentation

## 2019-11-15 DIAGNOSIS — M25562 Pain in left knee: Secondary | ICD-10-CM | POA: Insufficient documentation

## 2019-11-15 NOTE — ED Triage Notes (Signed)
Left knee pain. No known injury.

## 2019-11-15 NOTE — ED Notes (Signed)
Pt reported to registration clerk that they were going to leave

## 2019-11-16 ENCOUNTER — Other Ambulatory Visit: Payer: Self-pay

## 2019-11-16 ENCOUNTER — Encounter (HOSPITAL_BASED_OUTPATIENT_CLINIC_OR_DEPARTMENT_OTHER): Payer: Self-pay | Admitting: *Deleted

## 2019-11-16 ENCOUNTER — Emergency Department (HOSPITAL_BASED_OUTPATIENT_CLINIC_OR_DEPARTMENT_OTHER)
Admission: EM | Admit: 2019-11-16 | Discharge: 2019-11-17 | Disposition: A | Discharge status: Home / Self Care | Payer: Medicaid Other | Attending: Emergency Medicine | Admitting: Emergency Medicine

## 2019-11-16 DIAGNOSIS — G8929 Other chronic pain: Secondary | ICD-10-CM

## 2019-11-16 DIAGNOSIS — M25462 Effusion, left knee: Secondary | ICD-10-CM | POA: Insufficient documentation

## 2019-11-16 DIAGNOSIS — F17210 Nicotine dependence, cigarettes, uncomplicated: Secondary | ICD-10-CM | POA: Insufficient documentation

## 2019-11-16 DIAGNOSIS — M25562 Pain in left knee: Secondary | ICD-10-CM | POA: Insufficient documentation

## 2019-11-16 NOTE — ED Triage Notes (Signed)
He was here yesterday for knee pain. He had an xray. He left before seen by an MD.

## 2019-11-17 NOTE — ED Provider Notes (Signed)
[facility] EMERGENCY DEPARTMENT Provider Note  CSN: [id number] Arrival date & time: 11/16/19 2100  Chief Complaint(s) Knee Pain  HPI Evan Levy is a 24 y.o. male   CC: knee pain  Onset/Duration: on going for several years. This episode for 2-3 days Timing: intermittent, constant this epsode Location: left lateral knee Quality: aching Severity: mild to moderate Modifying Factors:  Improved by: immobility  Worsened by: walking, ROM Associated Signs/Symptoms:  Pertinent (+): mild swelling  Pertinent (-): redness, trauma,  Context: reports that fell asleep with his legs hanging of the bed and noted the pain in the morning.   Was here yesterday but LWBS after having xray.  HPI  Past Medical History History reviewed. No pertinent past medical history. There are no problems to display for this patient.  Home Medication(s) Prior to Admission medications   Medication Sig Start Date End Date Taking? Authorizing Provider  doxycycline (VIBRAMYCIN) 100 MG capsule Take 1 capsule (100 mg total) by mouth 2 (two) times daily. One po bid x 7 days 12/17/16   Palumbo, April, MD  HYDROcodone-acetaminophen (NORCO/VICODIN) 5-325 MG tablet Take 1 tablet by mouth every 4 (four) hours as needed. 07/01/15   Eustace Quail                                                                                                                                    Past Surgical History History reviewed. No pertinent surgical history. Family History No family history on file.  Social History Social History   Tobacco Use  . Smoking status: Current Every Day Smoker    Packs/day: 1.00    Types: Cigarettes  . Smokeless tobacco: Never Used  Substance Use Topics  . Alcohol use: No  . Drug use: No   Allergies Patient has no known allergies.  Review of Systems Review of Systems All other systems are reviewed and are negative for acute change except as noted in the HPI  Physical  Exam Vital Signs  I have reviewed the triage vital signs BP 129/76   Pulse 87   Temp 99.4 F (37.4 C) (Oral)   Resp 20   Ht 5' 8" (1.727 m)   Wt 85.3 kg   SpO2 96%   BMI 28.59 kg/m   Physical Exam Vitals reviewed.  Constitutional:      General: He is not in acute distress.    Appearance: He is well-developed. He is not diaphoretic.  HENT:     Head: Normocephalic and atraumatic.     Jaw: No trismus.     Right Ear: External ear normal.     Left Ear: External ear normal.     Nose: Nose normal.  Eyes:     General: No scleral icterus.    Conjunctiva/sclera: Conjunctivae normal.  Neck:     Trachea: Phonation normal.  Cardiovascular:     Rate and Rhythm: Normal rate and regular rhythm.  Pulmonary:     Effort: Pulmonary effort is normal. No respiratory distress.     Breath sounds: No stridor.  Abdominal:     General: There is no distension.  Musculoskeletal:        General: Normal range of motion.     Cervical back: Normal range of motion.     Right knee: No swelling, deformity, effusion, erythema, ecchymosis, lacerations or bony tenderness. Normal range of motion. No tenderness. Normal alignment.     Left knee: No swelling, deformity, effusion, ecchymosis, lacerations, bony tenderness or crepitus. Normal range of motion. Tenderness present over the lateral joint line and LCL. No LCL laxity, MCL laxity, ACL laxity or PCL laxity.Normal alignment and normal patellar mobility.     Instability Tests: Anterior drawer test negative. Posterior drawer test negative.  Neurological:     Mental Status: He is alert and oriented to person, place, and time.  Psychiatric:        Behavior: Behavior normal.     ED Results and Treatments Labs (all labs ordered are listed, but only abnormal results are displayed) Labs Reviewed - No data to display                                                                                                                       EKG  EKG  Interpretation  Date/Time:    Ventricular Rate:    PR Interval:    QRS Duration:   QT Interval:    QTC Calculation:   R Axis:     Text Interpretation:        Radiology No results found.  Pertinent labs & imaging results that were available during my care of the patient were reviewed by me and considered in my medical decision making (see chart for details).  Medications Ordered in ED Medications - No data to display                                                                                                                                  Procedures Procedures  (including critical care time)  Medical Decision Making / ED Course I have reviewed the nursing notes for this encounter and the patient's prior records (if available in EHR or on provided paperwork).   Chong Wojdyla was evaluated in Emergency Department on 11/17/2019 for the symptoms described in the history of present illness. He was evaluated in the context of the  global COVID-19 pandemic, which necessitated consideration that the patient might be at risk for infection with the SARS-CoV-2 virus that causes COVID-19. Institutional protocols and algorithms that pertain to the evaluation of patients at risk for COVID-19 are in a state of rapid change based on information released by regulatory bodies including the CDC and federal and state organizations. These policies and algorithms were followed during the patient's care in the ED.  Plain film from yesterday negative. Mild discomfort along the lateral ligament. Possible strain from sleeping position. No injury per patient. No suspicion for infectious process. RICE recommended      Final Clinical Impression(s) / ED Diagnoses Final diagnoses:  Chronic pain of left knee   The patient appears reasonably screened and/or stabilized for discharge and I doubt any other medical condition or other [facility] requiring further screening, evaluation, or treatment in the ED at this  time prior to discharge. Safe for discharge with strict return precautions.  Disposition: Discharge  Condition: Good  I have discussed the results, Dx and Tx plan with the patient/family who expressed understanding and agree(s) with the plan. Discharge instructions discussed at length. The patient/family was given strict return precautions who verbalized understanding of the instructions. No further questions at time of discharge.    ED Discharge Orders    None        Follow Up: Primary care provider  Schedule an appointment as soon as possible for a visit  If you do not have a primary care physician, contact [facility] at [phone] for referral      This chart was dictated using voice recognition software.  Despite best efforts to proofread,  errors can occur which can change the documentation meaning.   Fatima Blank, MD 11/17/19 [phone]

## 2022-01-14 ENCOUNTER — Encounter (HOSPITAL_BASED_OUTPATIENT_CLINIC_OR_DEPARTMENT_OTHER): Payer: Self-pay | Admitting: *Deleted

## 2022-01-14 ENCOUNTER — Other Ambulatory Visit: Payer: Self-pay

## 2022-01-14 ENCOUNTER — Emergency Department (HOSPITAL_BASED_OUTPATIENT_CLINIC_OR_DEPARTMENT_OTHER): Payer: Commercial Managed Care - HMO

## 2022-01-14 ENCOUNTER — Emergency Department (HOSPITAL_BASED_OUTPATIENT_CLINIC_OR_DEPARTMENT_OTHER)
Admission: EM | Admit: 2022-01-14 | Discharge: 2022-01-14 | Disposition: A | Discharge status: Home / Self Care | Payer: Commercial Managed Care - HMO | Attending: Emergency Medicine | Admitting: Emergency Medicine

## 2022-01-14 DIAGNOSIS — E700 Classical phenylketonuria: Secondary | ICD-10-CM | POA: Diagnosis not present

## 2022-01-14 DIAGNOSIS — R1013 Epigastric pain: Secondary | ICD-10-CM | POA: Insufficient documentation

## 2022-01-14 DIAGNOSIS — D72829 Elevated white blood cell count, unspecified: Secondary | ICD-10-CM | POA: Diagnosis not present

## 2022-01-14 DIAGNOSIS — R110 Nausea: Secondary | ICD-10-CM | POA: Diagnosis not present

## 2022-01-14 LAB — URINALYSIS, ROUTINE W REFLEX MICROSCOPIC
Bilirubin Urine: NEGATIVE
Glucose, UA: NEGATIVE mg/dL
Hgb urine dipstick: NEGATIVE
Ketones, ur: 40 mg/dL — AB
Leukocytes,Ua: NEGATIVE
Nitrite: NEGATIVE
Protein, ur: NEGATIVE mg/dL
Specific Gravity, Urine: 1.02 (ref 1.005–1.030)
pH: 7 (ref 5.0–8.0)

## 2022-01-14 LAB — CBC
HCT: 46.5 % (ref 39.0–52.0)
Hemoglobin: 15.8 g/dL (ref 13.0–17.0)
MCH: 29.4 pg (ref 26.0–34.0)
MCHC: 34 g/dL (ref 30.0–36.0)
MCV: 86.4 fL (ref 80.0–100.0)
Platelets: 242 10*3/uL (ref 150–400)
RBC: 5.38 MIL/uL (ref 4.22–5.81)
RDW: 13.2 % (ref 11.5–15.5)
WBC: 4.9 10*3/uL (ref 4.0–10.5)
nRBC: 0 % (ref 0.0–0.2)

## 2022-01-14 LAB — COMPREHENSIVE METABOLIC PANEL
ALT: 13 U/L (ref 0–44)
AST: 17 U/L (ref 15–41)
Albumin: 4.1 g/dL (ref 3.5–5.0)
Alkaline Phosphatase: 48 U/L (ref 38–126)
Anion gap: 9 (ref 5–15)
BUN: 13 mg/dL (ref 6–20)
CO2: 22 mmol/L (ref 22–32)
Calcium: 9.4 mg/dL (ref 8.9–10.3)
Chloride: 107 mmol/L (ref 98–111)
Creatinine, Ser: 1.28 mg/dL — ABNORMAL HIGH (ref 0.61–1.24)
GFR, Estimated: >60 mL/min (ref >60)
Glucose, Bld: 86 mg/dL (ref 70–99)
Potassium: 3.7 mmol/L (ref 3.5–5.1)
Sodium: 138 mmol/L (ref 135–145)
Total Bilirubin: 1.6 mg/dL — ABNORMAL HIGH (ref 0.3–1.2)
Total Protein: 6.7 g/dL (ref 6.5–8.1)

## 2022-01-14 LAB — LIPASE, BLOOD: Lipase: 43 U/L (ref 11–51)

## 2022-01-14 MED ORDER — SUCRALFATE 1 G PO TABS: 1.0000 g | ORAL_TABLET | Freq: Three times a day (TID) | ORAL | 0 refills | Status: AC

## 2022-01-14 MED ORDER — PANTOPRAZOLE SODIUM 20 MG PO TBEC: 20.0000 mg | DELAYED_RELEASE_TABLET | Freq: Every day | ORAL | 0 refills | Status: DC

## 2022-01-14 MED ORDER — ONDANSETRON HCL 4 MG/2ML IJ SOLN
4.0000 mg | Freq: Once | INTRAMUSCULAR | Status: AC
  Administered 2022-01-14: 4 mg via INTRAVENOUS
  Filled 2022-01-14: qty 2

## 2022-01-14 MED ORDER — LIDOCAINE VISCOUS HCL 2 % MT SOLN
15.0000 mL | Freq: Once | OROMUCOSAL | Status: AC
  Administered 2022-01-14: 15 mL via ORAL
  Filled 2022-01-14: qty 15

## 2022-01-14 MED ORDER — ALUM & MAG HYDROXIDE-SIMETH 200-200-20 MG/5ML PO SUSP
30.0000 mL | Freq: Once | ORAL | Status: AC
  Administered 2022-01-14: 30 mL via ORAL
  Filled 2022-01-14: qty 30

## 2022-01-14 MED ORDER — SODIUM CHLORIDE 0.9 % IV BOLUS
1000.0000 mL | Freq: Once | INTRAVENOUS | Status: AC
  Administered 2022-01-14: 1000 mL via INTRAVENOUS

## 2022-01-14 MED ORDER — MORPHINE SULFATE (PF) 4 MG/ML IV SOLN
4.0000 mg | Freq: Once | INTRAVENOUS | Status: AC
  Administered 2022-01-14: 4 mg via INTRAVENOUS
  Filled 2022-01-14: qty 1

## 2022-01-14 NOTE — Discharge Instructions (Addendum)
Take medication as prescribed  Return for new or worsening symptoms

## 2022-01-14 NOTE — ED Provider Notes (Signed)
[facility] EMERGENCY DEPARTMENT Provider Note   CSN: [id number] Arrival date & time: 01/14/22  1108    History  Chief Complaint  Patient presents with   Abdominal Pain    Evan Levy is a 26 y.o. male here for evaluation abdominal pain.  Began yesterday.  Located epigastric area.  Has had some nausea without any emesis.  1 episode of loose stool this morning.  No history of similar.  Pain not worse with food intake however has not attempted p.o. today due to his nausea.  No prior abdominal surgeries.  No alcohol use, NSAID use.  No melena or blood per rectum.  Does not use illicit substances or marijuana.  Pain was most severe this morning did not radiate to back.  Currently his pain has improved.  Feels like it is intermittent in nature however not typically associated with anything.  He has not taken any medications for symptoms.  No fever, chest pain, shortness of breath, back pain, urinary symptoms.  Did have family medical similar symptoms yesterday.     HPI    Home Medications Prior to Admission medications   Medication Sig Start Date End Date Taking? Authorizing Provider  pantoprazole (PROTONIX) 20 MG tablet Take 1 tablet (20 mg total) by mouth daily. 01/14/22  Yes Zarin Hagmann A, PA-C  sucralfate (CARAFATE) 1 g tablet Take 1 tablet (1 g total) by mouth 4 (four) times daily -  with meals and at bedtime for 14 days. 01/14/22 01/28/22 Yes Helayne Metsker A, PA-C  doxycycline (VIBRAMYCIN) 100 MG capsule Take 1 capsule (100 mg total) by mouth 2 (two) times daily. One po bid x 7 days 12/17/16   Palumbo, April, MD  HYDROcodone-acetaminophen (NORCO/VICODIN) 5-325 MG tablet Take 1 tablet by mouth every 4 (four) hours as needed. 07/01/15   Marella Chimes, PA-C      Allergies    Patient has no known allergies.    Review of Systems   Review of Systems  Constitutional: Negative.   HENT: Negative.    Respiratory: Negative.    Cardiovascular: Negative.    Gastrointestinal:  Positive for abdominal pain, diarrhea and nausea. Negative for abdominal distention, anal bleeding, blood in stool, constipation, rectal pain and vomiting.  Genitourinary: Negative.   Musculoskeletal: Negative.   Skin: Negative.   Neurological: Negative.   All other systems reviewed and are negative.  Physical Exam Updated Vital Signs BP (!) 136/94   Pulse 67   Temp 98.2 F (36.8 C) (Oral)   Resp 18   Wt 85.3 kg   SpO2 100%   BMI 28.59 kg/m  Physical Exam Vitals and nursing note reviewed.  Constitutional:      General: He is not in acute distress.    Appearance: He is well-developed. He is not ill-appearing, toxic-appearing or diaphoretic.  HENT:     Head: Normocephalic and atraumatic.  Eyes:     Pupils: Pupils are equal, round, and reactive to light.  Cardiovascular:     Rate and Rhythm: Normal rate and regular rhythm.     Heart sounds: Normal heart sounds.  Pulmonary:     Effort: Pulmonary effort is normal. No respiratory distress.     Breath sounds: Normal breath sounds.  Abdominal:     General: Bowel sounds are normal. There is no distension.     Palpations: Abdomen is soft.     Tenderness: There is abdominal tenderness in the epigastric area. There is no right CVA tenderness, left CVA tenderness, guarding  or rebound. Negative signs include Murphy's sign and McBurney's sign.     Hernia: No hernia is present.     Comments: Tenderness epigastric region, negative Murphy sign.  No rebound or guarding.  Musculoskeletal:        General: Normal range of motion.     Cervical back: Normal range of motion and neck supple.  Skin:    General: Skin is warm and dry.     Capillary Refill: Capillary refill takes less than 2 seconds.  Neurological:     General: No focal deficit present.     Mental Status: He is alert and oriented to person, place, and time.     ED Results / Procedures / Treatments   Labs (all labs ordered are listed, but only abnormal  results are displayed) Labs Reviewed  COMPREHENSIVE METABOLIC PANEL - Abnormal; Notable for the following components:      Result Value   Creatinine, Ser 1.28 (*)    Total Bilirubin 1.6 (*)    All other components within normal limits  URINALYSIS, ROUTINE W REFLEX MICROSCOPIC - Abnormal; Notable for the following components:   Ketones, ur 40 (*)    All other components within normal limits  LIPASE, BLOOD  CBC    EKG EKG Interpretation  Date/Time:  Monday January 14 2022 11:34:47 EDT Ventricular Rate:  80 PR Interval:  152 QRS Duration: 90 QT Interval:  360 QTC Calculation: 415 R Axis:   79 Text Interpretation: Normal sinus rhythm Normal ECG No previous ECGs available no prior ECG for comparison. NO STEMI Confirmed by Antony Blackbird [phone]) on 01/14/2022 6:13:19 PM  Radiology US Abdomen Limited RUQ (LIVER/GB)  Result Date: 01/14/2022 CLINICAL DATA:  Epigastric pain EXAM: ULTRASOUND ABDOMEN LIMITED RIGHT UPPER QUADRANT COMPARISON:  None Available. FINDINGS: Gallbladder: No gallstones or wall thickening visualized. No sonographic Murphy sign noted by sonographer. Common bile duct: Diameter: 3.7 mm Liver: No focal lesion identified. Within normal limits in parenchymal echogenicity. Portal vein is patent on color Doppler imaging with normal direction of blood flow towards the liver. Other: None. IMPRESSION: Normal right upper quadrant ultrasound Electronically Signed   By: Jerilynn Mages.  Shick M.D.   On: 01/14/2022 17:56    Procedures Procedures    Medications Ordered in ED Medications  morphine (PF) 4 MG/ML injection 4 mg (4 mg Intravenous Given 01/14/22 1632)  ondansetron (ZOFRAN) injection 4 mg (4 mg Intravenous Given 01/14/22 1632)  sodium chloride 0.9 % bolus 1,000 mL (0 mLs Intravenous Stopped 01/14/22 1815)  alum & mag hydroxide-simeth (MAALOX/MYLANTA) 200-200-20 MG/5ML suspension 30 mL (30 mLs Oral Given 01/14/22 1631)    And  lidocaine (XYLOCAINE) 2 % viscous mouth solution 15 mL (15  mLs Oral Given 01/14/22 1631)    ED Course/ Medical Decision Making/ A&P Clinical Course as of 01/14/22 1847  Mon Jan 14, 2022  1800 US Abdomen Limited RUQ (LIVER/GB) [KD]    Clinical Course User Index [KD] Lynne Logan, Student-PA    26 year old otherwise well-appearing male here for evaluation of epigastric abdominal pain which began yesterday.  Intermittent in nature however not associate with food intake.  Some nausea without vomiting.  1 episode of loose stool without melena or rectal or per rectum.  No prior abdominal surgery still has gallbladder.  Nuys EtOH use, chronic NSAID use, history of PUD.  No history of GERD or reflux.  No chest pain, shortness of breath or back pain.  Labs and imaging personally viewed and interpreted:  CBC without leukocytosis Lipase 43  UA negative for infection, does have ketonuria Metabolic panel creatinine 1.28, no prior to compare, T. bili 1.6 normal remaining LFTs [place] RUQ without stones, sludge, bile duct dilation  Patient reassessed.  Symptoms improved.  He is tolerating p.o. intake.  Requesting discharge home.  I feels is reasonable.  We will treat symptomatically.  Repeat abdominal exam benign.  Do not feel he needs CT scan at this time.  We will have him return for new or worsening symptoms.  On repeat exam patient does not have a surgical abdomin and there are no peritoneal signs.  No indication of appendicitis, bowel obstruction, bowel perforation, cholecystitis, diverticulitis.    The patient has been appropriately medically screened and/or stabilized in the ED. I have low suspicion for any other emergent medical condition which would require further screening, evaluation or treatment in the ED or require inpatient management.  Patient is hemodynamically stable and in no acute distress.  Patient able to ambulate in department prior to ED.  Evaluation does not show acute pathology that would require ongoing or additional emergent interventions  while in the emergency department or further inpatient treatment.  I have discussed the diagnosis with the patient and answered all questions.  Pain is been managed while in the emergency department and patient has no further complaints prior to discharge.  Patient is comfortable with plan discussed in room and is stable for discharge at this time.  I have discussed strict return precautions for returning to the emergency department.  Patient was encouraged to follow-up with PCP/specialist refer to at discharge.                             Medical Decision Making Amount and/or Complexity of Data Reviewed Independent Historian: friend External Data Reviewed: labs, radiology and notes. Labs: ordered. Decision-making details documented in ED Course. Radiology: ordered and independent interpretation performed. Decision-making details documented in ED Course.  Risk OTC drugs. Prescription drug management. Parenteral controlled substances. Decision regarding hospitalization. Diagnosis or treatment significantly limited by social determinants of health.          Final Clinical Impression(s) / ED Diagnoses Final diagnoses:  Abdominal pain, epigastric    Rx / DC Orders ED Discharge Orders          Ordered    pantoprazole (PROTONIX) 20 MG tablet  Daily        01/14/22 1843    sucralfate (CARAFATE) 1 g tablet  3 times daily with meals & bedtime        01/14/22 1843              Deionna Marcantonio A, PA-C 01/14/22 1848    Tegeler, Gwenyth Allegra, MD 01/14/22 2026

## 2022-01-14 NOTE — ED Triage Notes (Signed)
Pt is here for abdominal pain which began last pm.  Pt notes that his children had a "tomach bug".  Pt states that pain is in upper abdomen and he felt nauseated and had diarrhea x1 this am.  No fever or gu symptoms.

## 2022-01-14 NOTE — ED Notes (Signed)
Pt given water for PO challenge

## 2022-03-05 ENCOUNTER — Other Ambulatory Visit: Payer: Self-pay

## 2022-03-05 ENCOUNTER — Emergency Department (HOSPITAL_BASED_OUTPATIENT_CLINIC_OR_DEPARTMENT_OTHER)
Admission: EM | Admit: 2022-03-05 | Discharge: 2022-03-06 | Disposition: A | Discharge status: Home / Self Care | Payer: Commercial Managed Care - HMO | Attending: Emergency Medicine | Admitting: Emergency Medicine

## 2022-03-05 ENCOUNTER — Encounter (HOSPITAL_BASED_OUTPATIENT_CLINIC_OR_DEPARTMENT_OTHER): Payer: Self-pay | Admitting: Emergency Medicine

## 2022-03-05 DIAGNOSIS — R1013 Epigastric pain: Secondary | ICD-10-CM | POA: Diagnosis not present

## 2022-03-05 DIAGNOSIS — R110 Nausea: Secondary | ICD-10-CM | POA: Diagnosis not present

## 2022-03-05 LAB — CBC WITH DIFFERENTIAL/PLATELET
Abs Immature Granulocytes: 0.01 10*3/uL (ref 0.00–0.07)
Basophils Absolute: 0 10*3/uL (ref 0.0–0.1)
Basophils Relative: 0 %
Eosinophils Absolute: 0.2 10*3/uL (ref 0.0–0.5)
Eosinophils Relative: 3 %
HCT: 48.5 % (ref 39.0–52.0)
Hemoglobin: 17.1 g/dL — ABNORMAL HIGH (ref 13.0–17.0)
Immature Granulocytes: 0 %
Lymphocytes Relative: 38 %
Lymphs Abs: 2.3 10*3/uL (ref 0.7–4.0)
MCH: 30 pg (ref 26.0–34.0)
MCHC: 35.3 g/dL (ref 30.0–36.0)
MCV: 85.1 fL (ref 80.0–100.0)
Monocytes Absolute: 1 10*3/uL (ref 0.1–1.0)
Monocytes Relative: 17 %
Neutro Abs: 2.7 10*3/uL (ref 1.7–7.7)
Neutrophils Relative %: 42 %
Platelets: 275 10*3/uL (ref 150–400)
RBC: 5.7 MIL/uL (ref 4.22–5.81)
RDW: 13.5 % (ref 11.5–15.5)
WBC: 6.2 10*3/uL (ref 4.0–10.5)
nRBC: 0 % (ref 0.0–0.2)

## 2022-03-05 LAB — COMPREHENSIVE METABOLIC PANEL
ALT: 15 U/L (ref 0–44)
AST: 17 U/L (ref 15–41)
Albumin: 3.8 g/dL (ref 3.5–5.0)
Alkaline Phosphatase: 57 U/L (ref 38–126)
Anion gap: 7 (ref 5–15)
BUN: 9 mg/dL (ref 6–20)
CO2: 24 mmol/L (ref 22–32)
Calcium: 8.6 mg/dL — ABNORMAL LOW (ref 8.9–10.3)
Chloride: 106 mmol/L (ref 98–111)
Creatinine, Ser: 1.4 mg/dL — ABNORMAL HIGH (ref 0.61–1.24)
GFR, Estimated: >60 mL/min (ref >60)
Glucose, Bld: 94 mg/dL (ref 70–99)
Potassium: 3.8 mmol/L (ref 3.5–5.1)
Sodium: 137 mmol/L (ref 135–145)
Total Bilirubin: 0.8 mg/dL (ref 0.3–1.2)
Total Protein: 6.5 g/dL (ref 6.5–8.1)

## 2022-03-05 LAB — LIPASE, BLOOD: Lipase: 46 U/L (ref 11–51)

## 2022-03-05 MED ORDER — PANTOPRAZOLE SODIUM 40 MG PO TBEC
40.0000 mg | DELAYED_RELEASE_TABLET | Freq: Once | ORAL | Status: AC
  Administered 2022-03-06: 40 mg via ORAL
  Filled 2022-03-05: qty 1

## 2022-03-05 MED ORDER — LIDOCAINE VISCOUS HCL 2 % MT SOLN
15.0000 mL | Freq: Once | OROMUCOSAL | Status: AC
  Administered 2022-03-05: 15 mL via OROMUCOSAL
  Filled 2022-03-05: qty 15

## 2022-03-05 MED ORDER — ONDANSETRON 4 MG PO TBDP
4.0000 mg | ORAL_TABLET | Freq: Once | ORAL | Status: AC
  Administered 2022-03-06: 4 mg via ORAL
  Filled 2022-03-05: qty 1

## 2022-03-05 MED ORDER — ALUM & MAG HYDROXIDE-SIMETH 200-200-20 MG/5ML PO SUSP
30.0000 mL | Freq: Once | ORAL | Status: AC
  Administered 2022-03-05: 30 mL via ORAL
  Filled 2022-03-05: qty 30

## 2022-03-05 NOTE — ED Triage Notes (Signed)
Pt c/o epigastric pain that started today. Describes pain as cramping. Hx of gastric ulcers. States feels the same.

## 2022-03-05 NOTE — ED Provider Notes (Signed)
[facility] Provider Note   CSN: [id number] Arrival date & time: 03/05/22  2132     History {Add pertinent medical, surgical, social history, OB history to HPI:1} Chief Complaint  Patient presents with   Abdominal Pain    Evan Levy is Levy 26 y.o. male, who presents to the ED secondary to epigastric pain for the last day, that is described as cramping, does not radiate to the back.  States that it is worse after he eats, and has had some nausea that he has not been able to eat.  Denies any changes in bowels, no vomiting.  States that he was diagnosed with ulcers before, but has not taken his Protonix.  States he first noticed it after he ate some spicy wings.  Denies any chest pain, shortness of breath, fever or chills.     Home Medications Prior to Admission medications   Medication Sig Start Date End Date Taking? Authorizing Provider  doxycycline (VIBRAMYCIN) 100 MG capsule Take 1 capsule (100 mg total) by mouth 2 (two) times daily. One po bid x 7 days 12/17/16   Levy, April, MD  HYDROcodone-acetaminophen (NORCO/VICODIN) 5-325 MG tablet Take 1 tablet by mouth every 4 (four) hours as needed. 07/01/15   Evan Chimes, PA-C  pantoprazole (PROTONIX) 20 MG tablet Take 1 tablet (20 mg total) by mouth daily. 01/14/22   Evan, Britni A, PA-C  sucralfate (CARAFATE) 1 g tablet Take 1 tablet (1 g total) by mouth 4 (four) times daily -  with meals and at bedtime for 14 days. 01/14/22 01/28/22  Evan, Britni A, PA-C      Allergies    Patient has no known allergies.    Review of Systems   Review of Systems  Respiratory:  Negative for shortness of breath.   Cardiovascular:  Negative for chest pain.  Gastrointestinal:  Positive for abdominal pain and nausea. Negative for constipation and vomiting.    Physical Exam Updated Vital Signs BP (!) 116/51 (BP Location: Left Arm)   Pulse 81   Temp 98 F (36.7 C)   Resp 20   Ht 5' 8" (1.727 m)   Wt  77.1 kg   SpO2 98%   BMI 25.85 kg/m  Physical Exam Vitals and nursing note reviewed.  Constitutional:      General: He is not in acute distress.    Appearance: He is well-developed.  HENT:     Head: Normocephalic and atraumatic.  Eyes:     Conjunctiva/sclera: Conjunctivae normal.  Cardiovascular:     Rate and Rhythm: Normal rate and regular rhythm.     Heart sounds: No murmur heard. Pulmonary:     Effort: Pulmonary effort is normal. No respiratory distress.     Breath sounds: Normal breath sounds.  Abdominal:     Palpations: Abdomen is soft.     Tenderness: There is abdominal tenderness in the epigastric area. There is no guarding or rebound.  Musculoskeletal:        General: No swelling.     Cervical back: Neck supple.  Skin:    General: Skin is warm and dry.     Capillary Refill: Capillary refill takes less than 2 seconds.  Neurological:     Mental Status: He is alert.  Psychiatric:        Mood and Affect: Mood normal.     ED Results / Procedures / Treatments   Labs (all labs ordered are listed, but only abnormal results are displayed) Labs  Reviewed  COMPREHENSIVE METABOLIC PANEL - Abnormal; Notable for the following components:      Result Value   Creatinine, Ser 1.40 (*)    Calcium 8.6 (*)    All other components within normal limits  CBC WITH DIFFERENTIAL/PLATELET - Abnormal; Notable for the following components:   Hemoglobin 17.1 (*)    All other components within normal limits  LIPASE, BLOOD  URINALYSIS, ROUTINE W REFLEX MICROSCOPIC    EKG None  Radiology No results found.  Procedures Procedures   Medications Ordered in ED Medications  alum & mag hydroxide-simeth (MAALOX/MYLANTA) 200-200-20 MG/5ML suspension 30 mL (30 mLs Oral Given 03/05/22 2148)  lidocaine (XYLOCAINE) 2 % viscous mouth solution 15 mL (15 mLs Mouth/Throat Given 03/05/22 2148)    ED Course/ Medical Decision Making/ Levy&P                           Medical Decision  Making Amount and/or Complexity of Data Reviewed Labs: ordered.  Risk OTC drugs. Prescription drug management.   ***  {Document critical care time when appropriate:1} {Document review of labs and clinical decision tools ie heart score, Chads2Vasc2 etc:1}  {Document your independent review of radiology images, and any outside records:1} {Document your discussion with family members, caretakers, and with consultants:1} {Document social determinants of health affecting pt's care:1} {Document your decision making why or why not admission, treatments were needed:1} Final Clinical Impression(s) / ED Diagnoses Final diagnoses:  None    Rx / DC Orders ED Discharge Orders     None

## 2022-03-06 MED ORDER — OMEPRAZOLE 40 MG PO CPDR: 40.0000 mg | DELAYED_RELEASE_CAPSULE | Freq: Two times a day (BID) | ORAL | 0 refills | Status: AC

## 2022-03-06 MED ORDER — ONDANSETRON HCL 4 MG PO TABS: 4.0000 mg | ORAL_TABLET | Freq: Four times a day (QID) | ORAL | 0 refills | Status: AC

## 2022-03-06 NOTE — Discharge Instructions (Addendum)
Please take the medication as prescribed make sure you are taking your omeprazole twice a day every day.  You can use good Rx for coupon to get it for a lower price.  Additionally if you start having severe abdominal pain of vomiting blood, or having blood in your stool please return to the ER.

## 2022-03-16 ENCOUNTER — Emergency Department (HOSPITAL_BASED_OUTPATIENT_CLINIC_OR_DEPARTMENT_OTHER): Payer: Commercial Managed Care - HMO

## 2022-03-16 ENCOUNTER — Encounter (HOSPITAL_BASED_OUTPATIENT_CLINIC_OR_DEPARTMENT_OTHER): Payer: Self-pay

## 2022-03-16 ENCOUNTER — Emergency Department (HOSPITAL_BASED_OUTPATIENT_CLINIC_OR_DEPARTMENT_OTHER)
Admission: EM | Admit: 2022-03-16 | Discharge: 2022-03-17 | Disposition: A | Discharge status: Home / Self Care | Payer: Commercial Managed Care - HMO | Attending: Emergency Medicine | Admitting: Emergency Medicine

## 2022-03-16 ENCOUNTER — Other Ambulatory Visit: Payer: Self-pay

## 2022-03-16 DIAGNOSIS — R509 Fever, unspecified: Secondary | ICD-10-CM | POA: Diagnosis present

## 2022-03-16 DIAGNOSIS — Z20822 Contact with and (suspected) exposure to covid-19: Secondary | ICD-10-CM | POA: Insufficient documentation

## 2022-03-16 DIAGNOSIS — J189 Pneumonia, unspecified organism: Secondary | ICD-10-CM | POA: Diagnosis not present

## 2022-03-16 LAB — BASIC METABOLIC PANEL
Anion gap: 9 (ref 5–15)
BUN: 9 mg/dL (ref 6–20)
CO2: 23 mmol/L (ref 22–32)
Calcium: 8.6 mg/dL — ABNORMAL LOW (ref 8.9–10.3)
Chloride: 104 mmol/L (ref 98–111)
Creatinine, Ser: 1.3 mg/dL — ABNORMAL HIGH (ref 0.61–1.24)
GFR, Estimated: >60 mL/min (ref >60)
Glucose, Bld: 95 mg/dL (ref 70–99)
Potassium: 3.6 mmol/L (ref 3.5–5.1)
Sodium: 136 mmol/L (ref 135–145)

## 2022-03-16 LAB — CBC
HCT: 39.9 % (ref 39.0–52.0)
Hemoglobin: 13.7 g/dL (ref 13.0–17.0)
MCH: 29.6 pg (ref 26.0–34.0)
MCHC: 34.3 g/dL (ref 30.0–36.0)
MCV: 86.2 fL (ref 80.0–100.0)
Platelets: 355 10*3/uL (ref 150–400)
RBC: 4.63 MIL/uL (ref 4.22–5.81)
RDW: 13.2 % (ref 11.5–15.5)
WBC: 14.4 10*3/uL — ABNORMAL HIGH (ref 4.0–10.5)
nRBC: 0 % (ref 0.0–0.2)

## 2022-03-16 LAB — TROPONIN I (HIGH SENSITIVITY): Troponin I (High Sensitivity): <2 ng/L (ref <18)

## 2022-03-16 LAB — RESP PANEL BY RT-PCR (FLU A&B, COVID) ARPGX2
Influenza A by PCR: NEGATIVE
Influenza B by PCR: NEGATIVE
SARS Coronavirus 2 by RT PCR: NEGATIVE

## 2022-03-16 MED ORDER — CEPHALEXIN 250 MG PO CAPS
500.0000 mg | ORAL_CAPSULE | Freq: Once | ORAL | Status: AC
  Administered 2022-03-16: 500 mg via ORAL
  Filled 2022-03-16: qty 2

## 2022-03-16 MED ORDER — AZITHROMYCIN 250 MG PO TABS
500.0000 mg | ORAL_TABLET | Freq: Once | ORAL | Status: AC
  Administered 2022-03-16: 500 mg via ORAL
  Filled 2022-03-16: qty 2

## 2022-03-16 NOTE — ED Provider Notes (Signed)
[facility] Provider Note   CSN: [id number] Arrival date & time: 03/16/22  1950     History {Add pertinent medical, surgical, social history, OB history to HPI:1} Chief Complaint  Patient presents with   Chest Pain   Cough    Evan Levy is a 26 y.o. male.  The history is provided by the patient and medical records.  Chest Pain Associated symptoms: cough   Cough Associated symptoms: chest pain   Evan Levy is a 26 y.o. male who presents to the Emergency Department complaining of fever.  He presents to the ED for evaluation of left sided chest pain and fever that started two days ago.  Has pain in his chest when he takes a deep breath.  No sob.  Has cough productive of yellow sputum.  No hemoptysis.  Has runny nose.  No sore throat.   No abdominal pain, nausea, vomiting.  Has some soreness in bilateral calves.  No known medical problems.    No recent surgery or long travel.  No personal or family hx/o dvt/pe.       Home Medications Prior to Admission medications   Medication Sig Start Date End Date Taking? Authorizing Provider  doxycycline (VIBRAMYCIN) 100 MG capsule Take 1 capsule (100 mg total) by mouth 2 (two) times daily. One po bid x 7 days 12/17/16   Palumbo, April, MD  HYDROcodone-acetaminophen (NORCO/VICODIN) 5-325 MG tablet Take 1 tablet by mouth every 4 (four) hours as needed. 07/01/15   Marella Chimes, PA-C  omeprazole (PRILOSEC) 40 MG capsule Take 1 capsule (40 mg total) by mouth in the morning and at bedtime. 03/06/22   Small, Brooke L, PA  ondansetron (ZOFRAN) 4 MG tablet Take 1 tablet (4 mg total) by mouth every 6 (six) hours. 03/06/22   Small, Brooke L, PA  sucralfate (CARAFATE) 1 g tablet Take 1 tablet (1 g total) by mouth 4 (four) times daily -  with meals and at bedtime for 14 days. 01/14/22 01/28/22  Henderly, Britni A, PA-C      Allergies    Patient has no known allergies.    Review of Systems   Review of Systems   Respiratory:  Positive for cough.   Cardiovascular:  Positive for chest pain.  All other systems reviewed and are negative.   Physical Exam Updated Vital Signs BP 118/84   Pulse (!) 110   Temp 99.9 F (37.7 C)   Resp 16   SpO2 (!) 86%  Physical Exam Vitals and nursing note reviewed.  Constitutional:      Appearance: He is well-developed.  HENT:     Head: Normocephalic and atraumatic.  Cardiovascular:     Rate and Rhythm: Normal rate and regular rhythm.     Heart sounds: No murmur heard. Pulmonary:     Effort: Pulmonary effort is normal. No respiratory distress.     Breath sounds: Normal breath sounds.  Abdominal:     Palpations: Abdomen is soft.     Tenderness: There is no abdominal tenderness. There is no guarding or rebound.  Musculoskeletal:        General: No swelling or tenderness.  Skin:    General: Skin is warm and dry.  Neurological:     Mental Status: He is alert and oriented to person, place, and time.  Psychiatric:        Behavior: Behavior normal.     ED Results / Procedures / Treatments   Labs (all labs ordered are  listed, but only abnormal results are displayed) Labs Reviewed  BASIC METABOLIC PANEL - Abnormal; Notable for the following components:      Result Value   Creatinine, Ser 1.30 (*)    Calcium 8.6 (*)    All other components within normal limits  CBC - Abnormal; Notable for the following components:   WBC 14.4 (*)    All other components within normal limits  RESP PANEL BY RT-PCR (FLU A&B, COVID) ARPGX2  TROPONIN I (HIGH SENSITIVITY)  TROPONIN I (HIGH SENSITIVITY)    EKG EKG Interpretation  Date/Time:  Saturday March 16 2022 21:04:26 EST Ventricular Rate:  94 PR Interval:  144 QRS Duration: 84 QT Interval:  338 QTC Calculation: 422 R Axis:   56 Text Interpretation: Normal sinus rhythm Nonspecific T wave abnormality Abnormal ECG No significant change since last tracing Confirmed by Quintella Reichert [phone]) on 03/16/2022  11:01:47 PM  Radiology DG Chest 2 View  Result Date: 03/16/2022 CLINICAL DATA:  Chest pain.  Fevers cough and leg soreness. EXAM: CHEST - 2 VIEW COMPARISON:  None Available. FINDINGS: The heart size and mediastinal contours are within normal limits. Left lower lobe opacity concerning for atelectasis or infiltrate. The visualized skeletal structures are unremarkable. IMPRESSION: Left lower lobe opacity concerning for atelectasis or infiltrate. Electronically Signed   By: Keane Police D.O.   On: 03/16/2022 21:34    Procedures Procedures  {Document cardiac monitor, telemetry assessment procedure when appropriate:1}  Medications Ordered in ED Medications - No data to display  ED Course/ Medical Decision Making/ A&P                           Medical Decision Making Amount and/or Complexity of Data Reviewed Labs: ordered. Radiology: ordered.   ***  {Document critical care time when appropriate:1} {Document review of labs and clinical decision tools ie heart score, Chads2Vasc2 etc:1}  {Document your independent review of radiology images, and any outside records:1} {Document your discussion with family members, caretakers, and with consultants:1} {Document social determinants of health affecting pt's care:1} {Document your decision making why or why not admission, treatments were needed:1} Final Clinical Impression(s) / ED Diagnoses Final diagnoses:  None    Rx / DC Orders ED Discharge Orders     None

## 2022-03-16 NOTE — ED Triage Notes (Signed)
Pt with LT sided CP x2 days with fevers, cough, and leg soreness. Pt with intermittent nausea; denies diarrhea and vomiting.

## 2022-03-17 LAB — D-DIMER, QUANTITATIVE: D-Dimer, Quant: 0.45 ug/mL-FEU (ref 0.00–0.50)

## 2022-03-17 MED ORDER — BENZONATATE 100 MG PO CAPS: 100.0000 mg | ORAL_CAPSULE | Freq: Three times a day (TID) | ORAL | 0 refills | Status: AC | PRN

## 2022-03-17 MED ORDER — CEPHALEXIN 500 MG PO CAPS: 500.0000 mg | ORAL_CAPSULE | Freq: Two times a day (BID) | ORAL | 0 refills | Status: AC

## 2022-03-17 MED ORDER — AZITHROMYCIN 250 MG PO TABS: 250.0000 mg | ORAL_TABLET | Freq: Every day | ORAL | 0 refills | Status: AC
